# Patient Record
Sex: Female | Born: 1975 | Race: White | Hispanic: No | Marital: Single
Health system: Southern US, Community
[De-identification: ages and names within clinical notes are randomized; demographics above are authoritative.]

---

## 2021-01-28 ENCOUNTER — Emergency Department (HOSPITAL_COMMUNITY)
Admission: EM | Admit: 2021-01-28 | Discharge: 2021-01-28 | Disposition: A | Discharge status: Home / Self Care | Payer: Medicare Other | Attending: Emergency Medicine | Admitting: Emergency Medicine

## 2021-01-28 ENCOUNTER — Other Ambulatory Visit: Payer: Self-pay

## 2021-01-28 ENCOUNTER — Emergency Department (HOSPITAL_COMMUNITY): Payer: Medicare Other

## 2021-01-28 DIAGNOSIS — W0110XA Fall on same level from slipping, tripping and stumbling with subsequent striking against unspecified object, initial encounter: Secondary | ICD-10-CM | POA: Insufficient documentation

## 2021-01-28 DIAGNOSIS — M25552 Pain in left hip: Secondary | ICD-10-CM | POA: Diagnosis present

## 2021-01-28 DIAGNOSIS — W19XXXA Unspecified fall, initial encounter: Secondary | ICD-10-CM

## 2021-01-28 LAB — I-STAT BETA HCG BLOOD, ED (MC, WL, AP ONLY): I-stat hCG, quantitative: <5 m[IU]/mL (ref <5)

## 2021-01-28 MED ORDER — MORPHINE SULFATE (PF) 4 MG/ML IV SOLN
4.0000 mg | Freq: Once | INTRAVENOUS | Status: AC
Start: 2021-01-28 — End: 2021-01-28
  Administered 2021-01-28: 4 mg via INTRAVENOUS
  Filled 2021-01-28: qty 1

## 2021-01-28 MED ORDER — ONDANSETRON HCL 4 MG/2ML IJ SOLN
4.0000 mg | Freq: Once | INTRAMUSCULAR | Status: AC
  Administered 2021-01-28: 4 mg via INTRAVENOUS
  Filled 2021-01-28: qty 2

## 2021-01-28 MED ORDER — DIPHENHYDRAMINE HCL 50 MG/ML IJ SOLN
25.0000 mg | Freq: Once | INTRAMUSCULAR | Status: AC | PRN
  Administered 2021-01-28: 25 mg via INTRAVENOUS
  Filled 2021-01-28: qty 1

## 2021-01-28 MED ORDER — HYDROMORPHONE HCL 1 MG/ML IJ SOLN
1.0000 mg | Freq: Once | INTRAMUSCULAR | Status: AC
  Administered 2021-01-28: 1 mg via INTRAVENOUS
  Filled 2021-01-28: qty 1

## 2021-01-28 NOTE — ED Triage Notes (Signed)
Pt arrived via [facility] from [facility]. Per EMS pt slipped on water in bathroom causing her to fall on her left side/hip. Pt c/o pain in left hip and in legs bilateral since fall. EMS further reports pt is able to move extremities without obvious neuro deficit. Pt denies LOC or hitting her head, no obvious head trauma. Pt does not take blood thinner medication.   EMS admin - 1000 mg tylenol @ 1830  EMS VS - 180/100, HR 104, RR 24, SpO2 100%.

## 2021-01-28 NOTE — Discharge Instructions (Addendum)
You came to the emergency department today to be evaluated for your left hip pain after suffering a fall.  Your x-ray imaging showed no broken bones or dislocation.  Please follow-up with your orthopedic provider.  Please continue to take your prescribed pain medication as needed.    Additionally you may take Ibuprofen (Advil, motrin) and Tylenol (acetaminophen) to relieve your pain.    You may take up to 600 MG (3 pills) of normal strength ibuprofen every 8 hours as needed.   You make take tylenol, up to 650 mg every 8 hours as needed.   It is safe to take ibuprofen and tylenol at the same time as they work differently.   Do not take more than 3,000 mg tylenol in a 24 hour period (not more than one dose every 8 hours.  Please check all medication labels as many medications such as pain and cold medications may contain tylenol.  Do not drink alcohol while taking these medications.  Do not take other NSAID'S while taking ibuprofen (such as aleve or naproxen).  Please take ibuprofen with food to decrease stomach upset.  Get help right away if: You fall. You have a sudden increase in pain and swelling in your hip. Your hip is red or swollen or very tender to touch. You develop numbness to your legs or to your perineum You develop bowel or bladder incontinence or retention

## 2021-01-28 NOTE — ED Notes (Signed)
Pt verbalizes understanding of discharge instructions. Opportunity for questions and answers were provided. Pt discharged from the ED.   ?

## 2021-01-28 NOTE — ED Provider Notes (Signed)
[facility] EMERGENCY DEPARTMENT Provider Note   CSN: [id number] Arrival date & time: 01/28/21  1842     History Chief Complaint  Patient presents with   Marletta Lor    Carmen Russell is a 45 y.o. female with past medical history of fibromyalgia, anxiety, depression.  Presents to the emergency department with a chief complaint of left hip pain after suffering a fall.  Patient reports that fall happened approximately 1 hour prior to arrival in the emergency department.  Patient slipped and fell on wet floor.  Patient reports landing on her left side.  Denies hitting her head or any loss of consciousness.  Patient is not on any blood thinners.  Patient has pain to left hip.  Pain has been constant since fall.  Patient rates pain 9/10 the pain scale.  Pain is worse with movement or touch.  Patient had no relief of symptoms after receiving Tylenol with EMS.  Patient reports tingling sensation to left leg.  Patient denies any neck pain, back pain, numbness, weakness, saddle anesthesia, urinary or bowel incontinence, headache, visual disturbance.   Fall Pertinent negatives include no chest pain, no abdominal pain, no headaches and no shortness of breath.      No past medical history on file.  There are no problems to display for this patient.      OB History   No obstetric history on file.     No family history on file.     Home Medications Prior to Admission medications   Not on File    Allergies    Aspirin, Fentanyl, Toradol [ketorolac tromethamine], and Tramadol  Review of Systems   Review of Systems  Constitutional:  Negative for chills and fever.  HENT:  Negative for facial swelling.   Eyes:  Negative for visual disturbance.  Respiratory:  Negative for shortness of breath.   Cardiovascular:  Negative for chest pain.  Gastrointestinal:  Negative for abdominal pain, nausea and vomiting.  Genitourinary:  Negative for enuresis.  Musculoskeletal:  Positive  for arthralgias. Negative for back pain and neck pain.  Skin:  Negative for color change and rash.  Neurological:  Negative for dizziness, syncope, weakness, light-headedness, numbness and headaches.  Psychiatric/Behavioral:  Negative for confusion.    Physical Exam Updated Vital Signs BP 121/80 (BP Location: Right Arm)   Pulse 90   Temp 98 F (36.7 C) (Oral)   Resp (!) 22   SpO2 99%   Physical Exam Vitals and nursing note reviewed.  Constitutional:      General: She is not in acute distress.    Appearance: She is obese. She is not ill-appearing, toxic-appearing or diaphoretic.     Comments: Appears uncomfortable due to complaints of pain  HENT:     Head: Normocephalic and atraumatic. No raccoon eyes, Battle's sign, abrasion, contusion, masses, right periorbital erythema, left periorbital erythema or laceration.  Eyes:     General: No scleral icterus.       Right eye: No discharge.        Left eye: No discharge.  Cardiovascular:     Rate and Rhythm: Normal rate.     Pulses:          Dorsalis pedis pulses are 2+ on the left side.  Pulmonary:     Effort: Pulmonary effort is normal.  Musculoskeletal:     Cervical back: Normal, normal range of motion and neck supple. No edema, erythema, signs of trauma, rigidity, torticollis or crepitus. No pain with  movement, spinous process tenderness or muscular tenderness. Normal range of motion.     Thoracic back: No swelling, edema, deformity, signs of trauma, lacerations, spasms, tenderness or bony tenderness.     Lumbar back: No swelling, edema, deformity, signs of trauma, lacerations, spasms, tenderness or bony tenderness.     Left hip: Tenderness and bony tenderness present. No deformity, lacerations or crepitus. Decreased range of motion. Normal strength.     Left upper leg: Normal.     Left knee: No swelling, deformity, effusion, erythema, ecchymosis, lacerations, bony tenderness or crepitus. No tenderness. Normal alignment.     Left  lower leg: Normal.     Left ankle: No swelling, deformity, ecchymosis or lacerations. No tenderness. Normal range of motion.     Left foot: Normal range of motion and normal capillary refill. No swelling, deformity, laceration, tenderness or bony tenderness.     Comments: Patient has difficulty straightening the left lower leg due to complaints of pain.  Patient has tenderness to left hip.  Pulse, motor, and sensation intact distally.  No tenderness, bony tenderness, or deformity to bilateral upper extremities or right lower extremity.  No midline tenderness or deformity to cervical, thoracic or lumbar spine.  Feet:     Comments: Cap refill less than 2 seconds in all digits of left foot.  Sensation intact to all digits of left foot. Skin:    General: Skin is warm and dry.  Neurological:     General: No focal deficit present.     Mental Status: She is alert.  Psychiatric:        Behavior: Behavior is cooperative.    ED Results / Procedures / Treatments   Labs (all labs ordered are listed, but only abnormal results are displayed) Labs Reviewed  I-STAT BETA HCG BLOOD, ED ([facility], [facility], [facility] ONLY)    EKG None  Radiology DG Hip Unilat W or Wo Pelvis 2-3 Views Left  Result Date: 01/28/2021 CLINICAL DATA:  Recent fall with left hip pain, initial encounter EXAM: DG HIP (WITH OR WITHOUT PELVIS) 3V LEFT COMPARISON:  None. FINDINGS: Pelvic ring is intact. 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 Embolization coils are noted bilaterally in the pelvis. Proximal left femur is within normal limits. No acute fracture or dislocation is seen. No soft tissue abnormality is noted. IMPRESSION: Postsurgical changes without acute abnormality. Electronically Signed   By: Alcide Clever M.D.   On: 01/28/2021 21:15    Procedures Procedures   Medications Ordered in ED Medications  morphine 4 MG/ML injection 4 mg (4 mg Intravenous Given 01/28/21 2016)  diphenhydrAMINE (BENADRYL) injection  25 mg (25 mg Intravenous Given 01/28/21 2029)  ondansetron (ZOFRAN) injection 4 mg (4 mg Intravenous Given 01/28/21 2017)  HYDROmorphone (DILAUDID) injection 1 mg (1 mg Intravenous Given 01/28/21 2114)    ED Course  I have reviewed the triage vital signs and the nursing notes.  Pertinent labs & imaging results that were available during my care of the patient were reviewed by me and considered in my medical decision making (see chart for details).    MDM Rules/Calculators/A&P                           Alert 45 year old female no acute distress, nontoxic appearing.  Patient appears uncomfortable due to complaints of left hip pain.  Patient suffered fall landing on left hip approximately 1 hour prior.  Patient denies hitting her head or  any loss of consciousness.  Patient is not on any blood thinners.  Patient unable to fully straighten left leg due to complaints of pain.  Patient has diffuse tenderness to left hip.  Will obtain x-ray imaging of left hip to evaluate for possible fracture or dislocation.  Pain management with morphine.  Patient denies any her head or any loss of consciousness.  Not on any blood thinners.  Head is atraumatic.  No midline tenderness or deformity to cervical spine.  Patient has full range of motion to neck.  Suspicion for intracranial or cervical spinal injury at this time.  Patient has improvement in pain after receiving pain medication.  X-ray imaging obtained shows no acute osseous abnormality.  Plan to discharge patient on this time with orthopedic follow-up.  Patient to continue taking her prescribed hydrocodone as needed.  Discussed results, findings, treatment and follow up. Patient advised of return precautions. Patient verbalized understanding and agreed with plan.   Final Clinical Impression(s) / ED Diagnoses Final diagnoses:  Fall, initial encounter  Left hip pain    Rx / DC Orders ED Discharge Orders     None        Berneice Heinrich 01/28/21 2139    Benjiman Core, MD 01/28/21 2213

## 2023-08-21 IMAGING — DX DG HIP (WITH OR WITHOUT PELVIS) 2-3V*L*
3 series · 3 of 3 positions shown · non-contrast
Comparison: None.

CLINICAL DATA: Recent fall with left hip pain, initial encounter

EXAM:
DG HIP (WITH OR WITHOUT PELVIS) 3V LEFT

[pelvis ap]
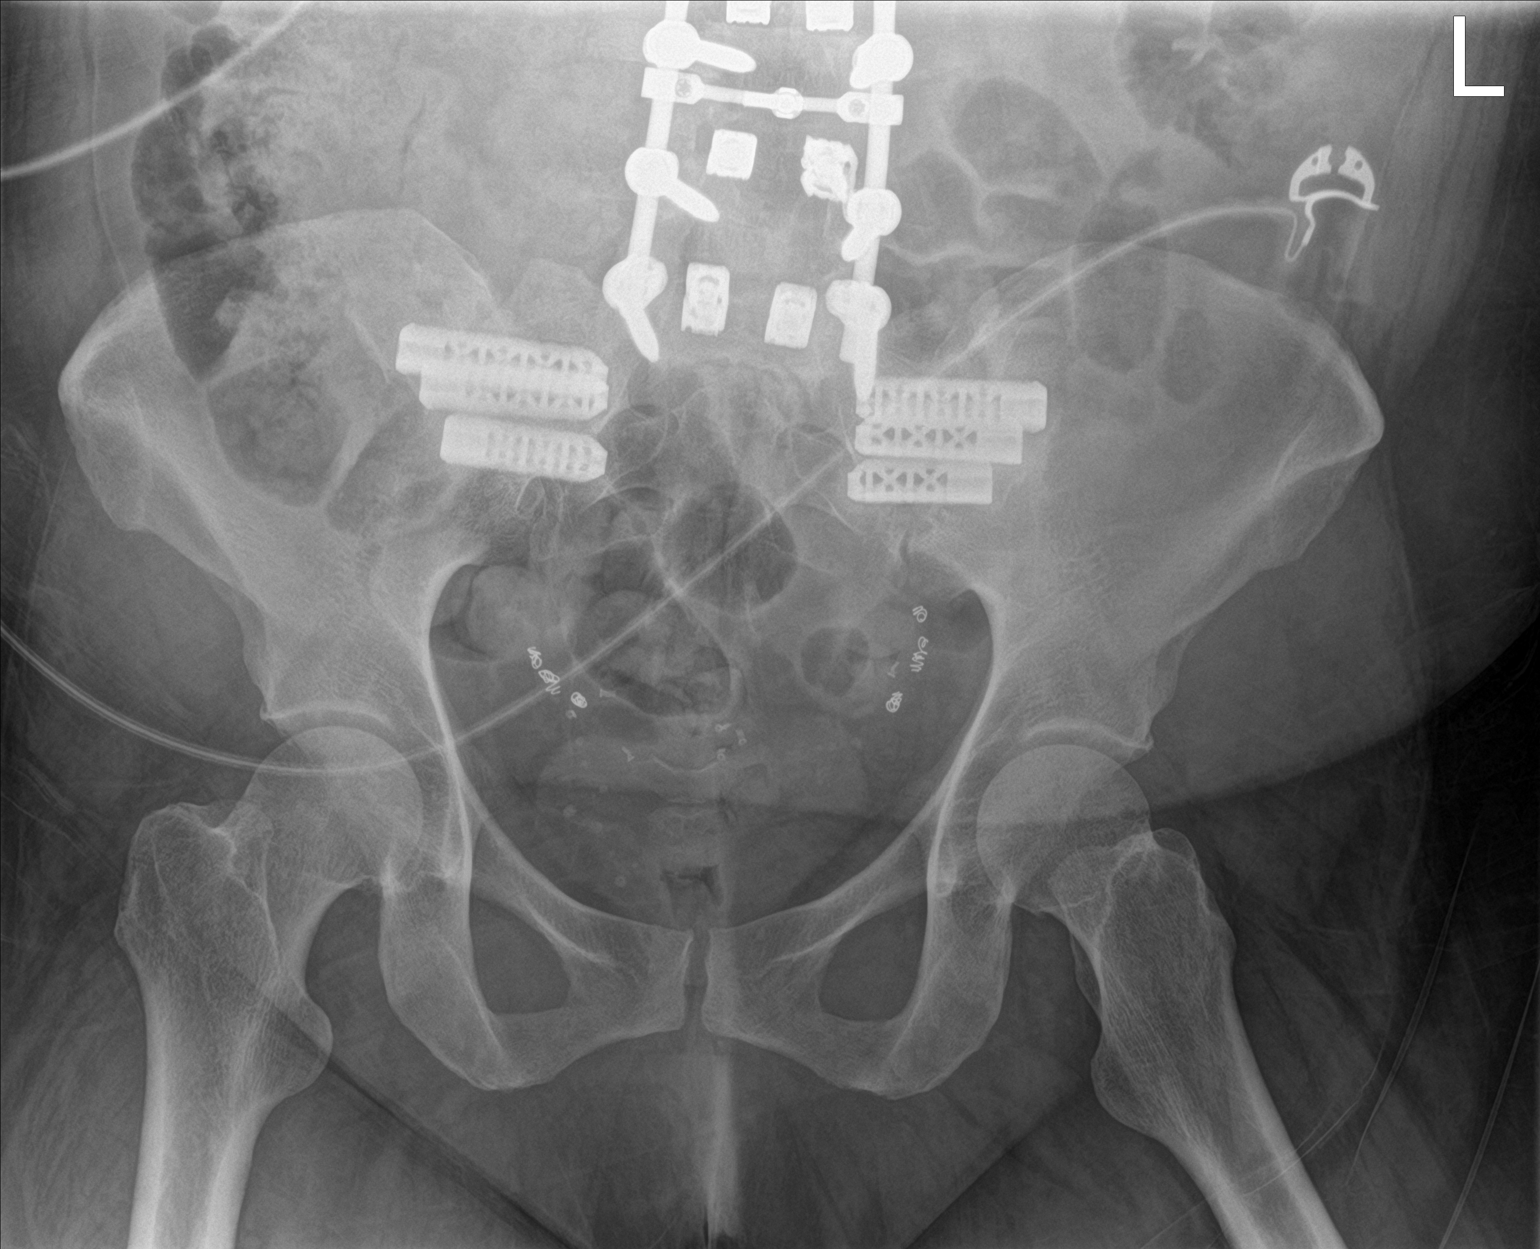

[hip ap]
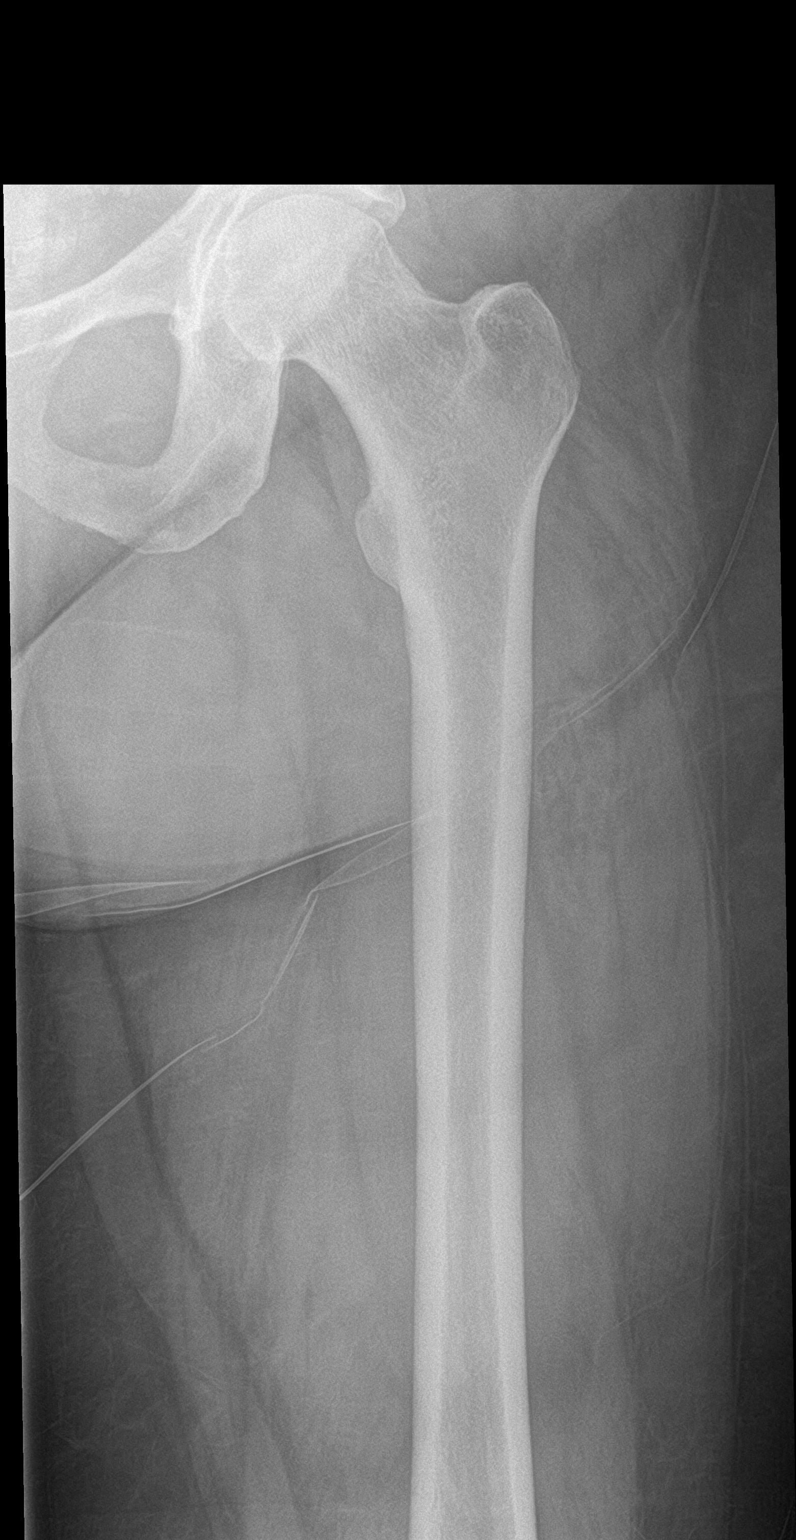

[hip lat]
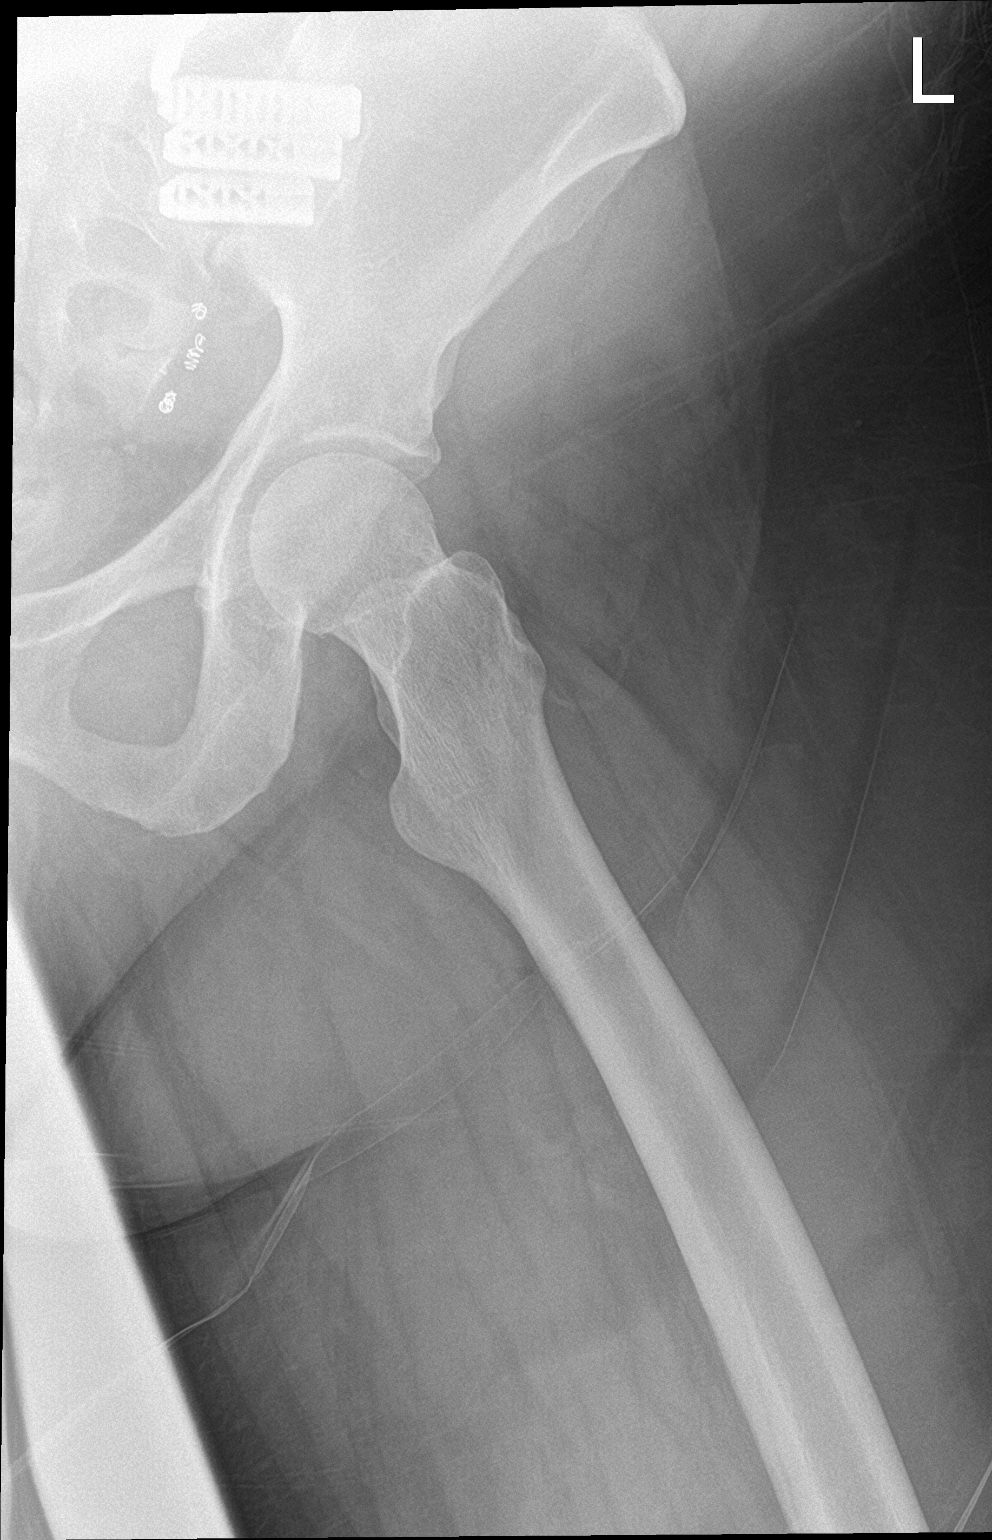

[3 of 3 positions shown; findings below may reference images not displayed]

FINDINGS: Pelvic ring is intact. Changes of prior sacroiliac joint fusion and
lumbar fusion are noted. Embolization coils are noted bilaterally in
the pelvis. Proximal left femur is within normal limits. No acute
fracture or dislocation is seen. No soft tissue abnormality is
noted.
IMPRESSION: Postsurgical changes without acute abnormality.
